# Patient Record
Sex: Male | Born: 2000 | Race: Black or African American | Hispanic: No | Marital: Single | Smoking: Never smoker
Health system: Southern US, Community
[De-identification: ages and names within clinical notes are randomized; demographics above are authoritative.]

## PROBLEM LIST (undated history)

## (undated) DIAGNOSIS — J45909 Unspecified asthma, uncomplicated: Secondary | ICD-10-CM

## (undated) HISTORY — DX: Unspecified asthma, uncomplicated: J45.909

## (undated) HISTORY — PX: NO PAST SURGERIES: SHX2092

---
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
## 2018-01-23 ENCOUNTER — Encounter (HOSPITAL_COMMUNITY): Payer: Self-pay | Admitting: Family Medicine

## 2018-01-23 ENCOUNTER — Ambulatory Visit (HOSPITAL_COMMUNITY)
Admission: EM | Admit: 2018-01-23 | Discharge: 2018-01-23 | Disposition: A | Discharge status: Home / Self Care | Payer: Medicaid Other | Attending: Family Medicine | Admitting: Family Medicine

## 2018-01-23 DIAGNOSIS — J069 Acute upper respiratory infection, unspecified: Secondary | ICD-10-CM

## 2018-01-23 NOTE — ED Provider Notes (Signed)
  [facility]   [id number] 01/23/18 Arrival Time: 1013  ASSESSMENT & PLAN:  1. Viral upper respiratory tract infection    Discussed typical duration of symptoms. OTC symptom care as needed; Sudafed. Work note given. Ensure adequate fluid intake and rest. May f/u with PCP or here as needed.  Reviewed expectations re: course of current medical issues. Questions answered. Outlined signs and symptoms indicating need for more acute intervention. Patient verbalized understanding. After Visit Summary given.   SUBJECTIVE: History from: patient.  Marc Duran is a 17 y.o. male who presents with complaint of nasal congestion, post-nasal drainage, and a persistent dry cough. Mild frontal HA; correlated with nasal congestion. Onset abrupt, approximately 4 days ago. Overall fatigued with body aches. SOB: none. Wheezing: none. Fever: no. Overall normal PO intake without n/v. Sick contacts: no. OTC treatment: Mucinex with mild help.   Social History   Tobacco Use  Smoking Status Never Smoker    ROS: As per HPI.   OBJECTIVE:  Vitals:   01/23/18 1109  BP: 131/71  Pulse: 76  Resp: 18  Temp: 98.8 F (37.1 C)  SpO2: 100%     General appearance: alert; appears fatigued HEENT: nasal congestion; clear runny nose; throat irritation secondary to post-nasal drainage Neck: supple without LAD Lungs: unlabored respirations, symmetrical air entry; cough: absent; no respiratory distress Skin: warm and dry Psychological: alert and cooperative; normal mood and affect   No Known Allergies  Past Medical History:  Diagnosis Date  . Asthma    History reviewed. No pertinent family history. Social History   Socioeconomic History  . Marital status: Single    Spouse name: Not on file  . Number of children: Not on file  . Years of education: Not on file  . Highest education level: Not on file  Social Needs  . Financial resource strain: Not on file  . Food insecurity -  worry: Not on file  . Food insecurity - inability: Not on file  . Transportation needs - medical: Not on file  . Transportation needs - non-medical: Not on file  Occupational History  . Not on file  Tobacco Use  . Smoking status: Never Smoker  Substance and Sexual Activity  . Alcohol use: Not on file  . Drug use: Not on file  . Sexual activity: Not on file  Other Topics Concern  . Not on file  Social History Narrative  . Not on file           Mardella LaymanHagler, Marc Fawaz, MD 01/23/18 1135

## 2018-01-23 NOTE — Discharge Instructions (Signed)
You may try over the counter Sudafed for a few days. This may help with your congestion.

## 2018-01-23 NOTE — ED Triage Notes (Signed)
Pt here for URI symptoms since Friday.  

## 2019-01-07 ENCOUNTER — Encounter: Payer: Self-pay | Admitting: Emergency Medicine

## 2019-01-07 ENCOUNTER — Ambulatory Visit
Admission: EM | Admit: 2019-01-07 | Discharge: 2019-01-07 | Disposition: A | Discharge status: Home / Self Care | Payer: Medicaid Other | Attending: Family Medicine | Admitting: Family Medicine

## 2019-01-07 DIAGNOSIS — R030 Elevated blood-pressure reading, without diagnosis of hypertension: Secondary | ICD-10-CM | POA: Insufficient documentation

## 2019-01-07 NOTE — ED Triage Notes (Addendum)
Pt states he was at the dentist today and his blood pressure was 150s/90s there. Denies history of high blood pressure. Pt does not have a PCP. Denies symptoms at this time, states he felt lightheaded once a few days ago.

## 2019-01-07 NOTE — ED Provider Notes (Signed)
[facility]    CSN: [id number] Arrival date & time: 01/07/19  1546     History   Chief Complaint Chief Complaint  Patient presents with  . Hypertension    HPI Marc Duran is a 18 y.o. male.   HPI  Patient is here for elevated blood pressure.  He went to the dentist today to have the tooth fixed.  His blood pressure was elevated and they refused to see him.  He took his blood pressure 3 times.  It stayed in the 150/90 range on 3 times.  He has never had high blood pressure before.  High blood pressure does run in his family.  I checked his chart and his blood pressures the last 2 visits is been normal.  He is not having any headaches dizziness visual symptoms nausea.  No chest pain shortness of breath. We discussed blood pressure today.  All the things that can cause him to have an elevated blood pressure.  He can be sleep deprivation, alcohol ingestion, stress, exercise, over-the-counter cold medicines, and so forth.  One elevated blood pressure does not mean he has the diagnosis of hypertension.  His blood pressure here is 150/82.  I explained to him that although this is elevated, it is not dangerous.  Recommend follow-up with the PCP.  PCP referral was given to patient   Past Medical History:  Diagnosis Date  . Asthma     There are no active problems to display for this patient.   History reviewed. No pertinent surgical history.     Home Medications    Prior to Admission medications   Not on File    Family History Family History  Problem Relation Age of Onset  . Healthy Mother   . Healthy Father   . Hypertension Maternal Grandmother     Social History Social History   Tobacco Use  . Smoking status: Never Smoker  . Smokeless tobacco: Current User  Substance Use Topics  . Alcohol use: Yes  . Drug use: Yes    Types: Marijuana     Allergies   Patient has no known allergies.   Review of Systems Review of Systems  Constitutional:  Negative for chills and fever.  HENT: Negative for ear pain and sore throat.   Eyes: Negative for pain and visual disturbance.  Respiratory: Negative for cough and shortness of breath.   Cardiovascular: Negative for chest pain and palpitations.  Gastrointestinal: Negative for abdominal pain and vomiting.  Genitourinary: Negative for dysuria and hematuria.  Musculoskeletal: Negative for arthralgias and back pain.  Skin: Negative for color change and rash.  Neurological: Negative for seizures and syncope.  All other systems reviewed and are negative.    Physical Exam Triage Vital Signs ED Triage Vitals  Enc Vitals Group     BP 01/07/19 1552 (!) 150/82     Pulse Rate 01/07/19 1552 (!) 55     Resp 01/07/19 1552 18     Temp 01/07/19 1552 97.7 F (36.5 C)     Temp src --      SpO2 01/07/19 1552 98 %     Weight --      Height --      Head Circumference --      Peak Flow --      Pain Score 01/07/19 1555 0     Pain Loc --      Pain Edu? --      Excl. in GC? --  No data found.  Updated Vital Signs BP (!) 150/82   Pulse (!) 55   Temp 97.7 F (36.5 C)   Resp 18   SpO2 98%   Visual Acuity Right Eye Distance:   Left Eye Distance:   Bilateral Distance:    Right Eye Near:   Left Eye Near:    Bilateral Near:     Physical Exam Constitutional:      General: He is not in acute distress.    Appearance: He is well-developed.  HENT:     Head: Normocephalic and atraumatic.  Eyes:     Conjunctiva/sclera: Conjunctivae normal.     Pupils: Pupils are equal, round, and reactive to light.  Neck:     Musculoskeletal: Normal range of motion.  Cardiovascular:     Rate and Rhythm: Regular rhythm. Bradycardia present.     Heart sounds: Normal heart sounds.  Pulmonary:     Effort: Pulmonary effort is normal. No respiratory distress.     Breath sounds: Normal breath sounds.  Abdominal:     General: There is no distension.     Palpations: Abdomen is soft.  Musculoskeletal: Normal  range of motion.  Skin:    General: Skin is warm and dry.  Neurological:     General: No focal deficit present.     Mental Status: He is alert. Mental status is at baseline.  Psychiatric:        Mood and Affect: Mood normal.        Thought Content: Thought content normal.      UC Treatments / Results  Labs (all labs ordered are listed, but only abnormal results are displayed) Labs Reviewed - No data to display  EKG None  Radiology No results found.  Procedures Procedures (including critical care time)  Medications Ordered in UC Medications - No data to display  Initial Impression / Assessment and Plan / UC Course  I have reviewed the triage vital signs and the nursing notes.  Pertinent labs & imaging results that were available during my care of the patient were reviewed by me and considered in my medical decision making (see chart for details).      Final Clinical Impressions(s) / UC Diagnoses   Final diagnoses:  Elevated blood-pressure reading without diagnosis of hypertension     Discharge Instructions     Blood pressure is elevated today A number of factors can cause your BP to go up We talked about diet, sleep, stress, alcohol and certain medicine You need to follow up with a PCP   ED Prescriptions    None     Controlled Substance Prescriptions [place] Controlled Substance Registry consulted? Not Applicable   Eustace Moore, MD 01/07/19 Windy Fast

## 2019-01-07 NOTE — Discharge Instructions (Signed)
Blood pressure is elevated today A number of factors can cause your BP to go up We talked about diet, sleep, stress, alcohol and certain medicine You need to follow up with a PCP

## 2019-01-24 ENCOUNTER — Ambulatory Visit (INDEPENDENT_AMBULATORY_CARE_PROVIDER_SITE_OTHER): Payer: Medicaid Other | Admitting: Family Medicine

## 2019-01-24 ENCOUNTER — Encounter: Payer: Self-pay | Admitting: Family Medicine

## 2019-01-24 VITALS — BP 155/98 | HR 65 | Resp 17 | Ht 67.0 in | Wt 154.0 lb

## 2019-01-24 DIAGNOSIS — Z7689 Persons encountering health services in other specified circumstances: Secondary | ICD-10-CM

## 2019-01-24 DIAGNOSIS — I10 Essential (primary) hypertension: Secondary | ICD-10-CM

## 2019-01-24 MED ORDER — HYDROCHLOROTHIAZIDE 25 MG PO TABS: 25.0000 mg | ORAL_TABLET | Freq: Every day | ORAL | 0 refills | Status: DC

## 2019-01-24 NOTE — Patient Instructions (Addendum)
[facility]!    Marc MannsKyshawn M Duran was seen by Joaquin CourtsKimberly Harris, FNP today.   [name] primary care provider is Bing NeighborsHarris, Kimberly S, FNP.   For the best care possible, you should try to see Joaquin CourtsKimberly Harris, FNP-C whenever you come to the clinic.   We look forward to seeing you again soon!  If you have any questions about your visit today, please call us at [phone] or feel free to reach your primary care provider via MyChart.       Hypertension Hypertension, commonly called high blood pressure, is when the force of blood pumping through the arteries is too strong. The arteries are the blood vessels that carry blood from the heart throughout the body. Hypertension forces the heart to work harder to pump blood and may cause arteries to become narrow or stiff. Having untreated or uncontrolled hypertension can cause heart attacks, strokes, kidney disease, and other problems. A blood pressure reading consists of a higher number over a lower number. Ideally, your blood pressure should be below 120/80. The first ("top") number is called the systolic pressure. It is a measure of the pressure in your arteries as your heart beats. The second ("bottom") number is called the diastolic pressure. It is a measure of the pressure in your arteries as the heart relaxes. What are the causes? The cause of this condition is not known. What increases the risk? Some risk factors for high blood pressure are under your control. Others are not. Factors you can change  Smoking.  Having type 2 diabetes mellitus, high cholesterol, or both.  Not getting enough exercise or physical activity.  Being overweight.  Having too much fat, sugar, calories, or salt (sodium) in your diet.  Drinking too much alcohol. Factors that are difficult or impossible to change  Having chronic kidney disease.  Having a family history of high blood  pressure.  Age. Risk increases with age.  Race. You may be at higher risk if you are African-American.  Gender. Men are at higher risk than women before age 20. After age 20, women are at higher risk than men.  Having obstructive sleep apnea.  Stress. What are the signs or symptoms? Extremely high blood pressure (hypertensive crisis) may cause:  Headache.  Anxiety.  Shortness of breath.  Nosebleed.  Nausea and vomiting.  Severe chest pain.  Jerky movements you cannot control (seizures). How is this diagnosed? This condition is diagnosed by measuring your blood pressure while you are seated, with your arm resting on a surface. The cuff of the blood pressure monitor will be placed directly against the skin of your upper arm at the level of your heart. It should be measured at least twice using the same arm. Certain conditions can cause a difference in blood pressure between your right and left arms. Certain factors can cause blood pressure readings to be lower or higher than normal (elevated) for a short period of time:  When your blood pressure is higher when you are in a health care provider's office than when you are at home, this is called white coat hypertension. Most people with this condition do not need medicines.  When your blood pressure is higher at home than when you are in a health care provider's office, this is called masked hypertension. Most people with this condition may need medicines to control blood pressure. If you have a high blood pressure reading during  one visit or you have normal blood pressure with other risk factors:  You may be asked to return on a different day to have your blood pressure checked again.  You may be asked to monitor your blood pressure at home for 1 week or longer. If you are diagnosed with hypertension, you may have other blood or imaging tests to help your health care provider understand your overall risk for other conditions. How  is this treated? This condition is treated by making healthy lifestyle changes, such as eating healthy foods, exercising more, and reducing your alcohol intake. Your health care provider may prescribe medicine if lifestyle changes are not enough to get your blood pressure under control, and if:  Your systolic blood pressure is above 130.  Your diastolic blood pressure is above 80. Your personal target blood pressure may vary depending on your medical conditions, your age, and other factors. Follow these instructions at home: Eating and drinking   Eat a diet that is high in fiber and potassium, and low in sodium, added sugar, and fat. An example eating plan is called the DASH (Dietary Approaches to Stop Hypertension) diet. To eat this way: ? Eat plenty of fresh fruits and vegetables. Try to fill half of your plate at each meal with fruits and vegetables. ? Eat whole grains, such as whole wheat pasta, brown rice, or whole grain bread. Fill about one quarter of your plate with whole grains. ? Eat or drink low-fat dairy products, such as skim milk or low-fat yogurt. ? Avoid fatty cuts of meat, processed or cured meats, and poultry with skin. Fill about one quarter of your plate with lean proteins, such as fish, chicken without skin, beans, eggs, and tofu. ? Avoid premade and processed foods. These tend to be higher in sodium, added sugar, and fat.  Reduce your daily sodium intake. Most people with hypertension should eat less than 1,500 mg of sodium a day.  Limit alcohol intake to no more than 1 drink a day for nonpregnant women and 2 drinks a day for men. One drink equals 12 oz of beer, 5 oz of wine, or 1 oz of hard liquor. Lifestyle   Work with your health care provider to maintain a healthy body weight or to lose weight. Ask what an ideal weight is for you.  Get at least 30 minutes of exercise that causes your heart to beat faster (aerobic exercise) most days of the week. Activities may  include walking, swimming, or biking.  Include exercise to strengthen your muscles (resistance exercise), such as pilates or lifting weights, as part of your weekly exercise routine. Try to do these types of exercises for 30 minutes at least 3 days a week.  Do not use any products that contain nicotine or tobacco, such as cigarettes and e-cigarettes. If you need help quitting, ask your health care provider.  Monitor your blood pressure at home as told by your health care provider.  Keep all follow-up visits as told by your health care provider. This is important. Medicines  Take over-the-counter and prescription medicines only as told by your health care provider. Follow directions carefully. Blood pressure medicines must be taken as prescribed.  Do not skip doses of blood pressure medicine. Doing this puts you at risk for problems and can make the medicine less effective.  Ask your health care provider about side effects or reactions to medicines that you should watch for. Contact a health care provider if:  You think you  are having a reaction to a medicine you are taking.  You have headaches that keep coming back (recurring).  You feel dizzy.  You have swelling in your ankles.  You have trouble with your vision. Get help right away if:  You develop a severe headache or confusion.  You have unusual weakness or numbness.  You feel faint.  You have severe pain in your chest or abdomen.  You vomit repeatedly.  You have trouble breathing. Summary  Hypertension is when the force of blood pumping through your arteries is too strong. If this condition is not controlled, it may put you at risk for serious complications.  Your personal target blood pressure may vary depending on your medical conditions, your age, and other factors. For most people, a normal blood pressure is less than 120/80.  Hypertension is treated with lifestyle changes, medicines, or a combination of both.  Lifestyle changes include weight loss, eating a healthy, low-sodium diet, exercising more, and limiting alcohol. This information is not intended to replace advice given to you by your health care provider. Make sure you discuss any questions you have with your health care provider. Document Released: 11/28/2005 Document Revised: 10/26/2016 Document Reviewed: 10/26/2016 Elsevier Interactive Patient Education  2019 [employer].

## 2019-01-24 NOTE — Progress Notes (Signed)
Marc Duran, is a 18 y.o. male  ZRA:[id number]  LKT:[id number]  DOB - 2001-10-19  CC:  Chief Complaint  Patient presents with  . Establish Care  . Hypertension       HPI: Marc Duran is a 18 y.o. male is here today to establish care evaluation of elevated blood pressure readings.  [facility] does not have a problem list on file.   Hypertension, new  Patient presents today for evaluation of multiple recent elevation in blood pressure readings. He has no extreme hypertension or renal disease.  He reports no history of an abnormal birth or congenital anomalies.  He was recently at the dentist to have some dental work performed and he brings with him his readings.  His blood pressure was elevated greater than 150/90 with multiple readings.  He also subsequently presented to urgent care patient on 01/07/2019 and was noted to be hypertensive during that visit as well with a blood pressure reading of 150/82.  He endorses heavy marijuana use on a consistent daily basis.  Denies any alcohol or other illicit drug use.  He has a family history significant for hypertension in both parents.  Denies new headaches, chest pain, visual changes, abdominal pain, nausea, new weakness , numbness or tingling, SOB, edema, or worrisome cough.   Current medications:No current outpatient medications on file.   Pertinent family medical history: family history includes Diabetes in his maternal grandmother; Healthy in his mother; Hypertension in his father, maternal grandfather, maternal grandmother, paternal grandfather, and paternal grandmother.   No Known Allergies  Social History   Socioeconomic History  . Marital status: Single    Spouse name: Not on file  . Number of children: Not on file  . Years of education: Not on file  . Highest education level: Not on file  Occupational History  . Not on file  Social Needs  . Financial resource strain: Not on file  . Food insecurity:    Worry: Not on file   Inability: Not on file  . Transportation needs:    Medical: Not on file    Non-medical: Not on file  Tobacco Use  . Smoking status: Never Smoker  . Smokeless tobacco: Never Used  Substance and Sexual Activity  . Alcohol use: Never    Frequency: Never  . Drug use: Yes    Types: Marijuana  . Sexual activity: Not on file  Lifestyle  . Physical activity:    Days per week: Not on file    Minutes per session: Not on file  . Stress: Not on file  Relationships  . Social connections:    Talks on phone: Not on file    Gets together: Not on file    Attends religious service: Not on file    Active member of club or organization: Not on file    Attends meetings of clubs or organizations: Not on file    Relationship status: Not on file  . Intimate partner violence:    Fear of current or ex partner: Not on file    Emotionally abused: Not on file    Physically abused: Not on file    Forced sexual activity: Not on file  Other Topics Concern  . Not on file  Social History Narrative  . Not on file    Review of Systems: Pertinent negatives listed in HPI Objective:   Vitals:   01/24/19 1512  BP: (!) 155/98  Pulse: 65  Resp: 17  SpO2: 98%  BP Readings from Last 3 Encounters:  01/24/19 (!) 155/98  01/07/19 (!) 150/82  01/23/18 131/71    Filed Weights   01/24/19 1512  Weight: 154 lb (69.9 kg)      Physical Exam: Constitutional: Patient appears well-developed and well-nourished. No distress. HENT: Normocephalic, atraumatic, External right and left ear normal.  Eyes: Conjunctivae and EOM are normal. PERRLA, no scleral icterus. Neck: Normal ROM. Neck supple. No JVD. No tracheal deviation. No thyromegaly. CVS: RRR, S1/S2 +, no murmurs, no gallops, no carotid bruit.  Pulmonary: Effort and breath sounds normal, no stridor, rhonchi, wheezes, rales.  Abdominal: Soft. BS +, no distension, tenderness, rebound or guarding.  Musculoskeletal: Normal range of motion. No edema and no  tenderness.  Neuro: Alert. Normal muscle tone coordination. Normal gait.  Skin: Skin is warm and dry. No rash noted. Not diaphoretic. No erythema. No pallor. Psychiatric: Normal mood and affect. Behavior, judgment, thought content normal.  Lab Results (prior encounters)       Assessment and plan:  1. Encounter to establish care  2. Essential hypertension, uncontrolled, new  -Start on HCTZ 25 mg once daily -Strongly encouraged discontinuation of marijuana smoking as this can adversely contribute to elevated BP readings.  Checking the following: - Comprehensive metabolic panel - CBC with Differential - TSH - Hemoglobin A1c  A total of 25 minutes spent, greater than 50 % of this time was spent counseling and coordination of care.   Return in about 6 weeks (around 03/07/2019).   The patient was given clear instructions to go to ER or return to medical center if symptoms don't improve, worsen or new problems develop. The patient verbalized understanding. The patient was advised  to call and obtain lab results if they haven't heard anything from out office within 7-10 business days.  Joaquin Courts, FNP Primary Care at [facility] [address], [place] [address] [phone]: [phone]    This note has been created with Dragon speech recognition software and Paediatric nurse. Any transcriptional errors are unintentional.

## 2019-01-25 LAB — CBC WITH DIFFERENTIAL/PLATELET
Basophils Absolute: 0 10*3/uL (ref 0.0–0.2)
Basos: 0 %
EOS (ABSOLUTE): 0.1 10*3/uL (ref 0.0–0.4)
EOS: 1 %
HEMATOCRIT: 50.1 % (ref 37.5–51.0)
HEMOGLOBIN: 17.8 g/dL — AB (ref 13.0–17.7)
IMMATURE GRANS (ABS): 0 10*3/uL (ref 0.0–0.1)
IMMATURE GRANULOCYTES: 0 %
LYMPHS: 42 %
Lymphocytes Absolute: 3.2 10*3/uL — ABNORMAL HIGH (ref 0.7–3.1)
MCH: 29.2 pg (ref 26.6–33.0)
MCHC: 35.5 g/dL (ref 31.5–35.7)
MCV: 82 fL (ref 79–97)
Monocytes Absolute: 0.8 10*3/uL (ref 0.1–0.9)
Monocytes: 10 %
NEUTROS PCT: 47 %
Neutrophils Absolute: 3.5 10*3/uL (ref 1.4–7.0)
PLATELETS: 323 10*3/uL (ref 150–450)
RBC: 6.09 x10E6/uL — ABNORMAL HIGH (ref 4.14–5.80)
RDW: 13 % (ref 11.6–15.4)
WBC: 7.6 10*3/uL (ref 3.4–10.8)

## 2019-01-25 LAB — COMPREHENSIVE METABOLIC PANEL
ALT: 14 IU/L (ref 0–44)
AST: 14 IU/L (ref 0–40)
Albumin/Globulin Ratio: 1.8 (ref 1.2–2.2)
Albumin: 5.3 g/dL — ABNORMAL HIGH (ref 4.1–5.2)
Alkaline Phosphatase: 94 IU/L (ref 39–117)
BUN/Creatinine Ratio: 10 (ref 9–20)
BUN: 9 mg/dL (ref 6–20)
Bilirubin Total: 0.7 mg/dL (ref 0.0–1.2)
CALCIUM: 10.7 mg/dL — AB (ref 8.7–10.2)
CO2: 25 mmol/L (ref 20–29)
CREATININE: 0.91 mg/dL (ref 0.76–1.27)
Chloride: 100 mmol/L (ref 96–106)
GFR, EST AFRICAN AMERICAN: 141 mL/min/{1.73_m2} (ref >59)
GFR, EST NON AFRICAN AMERICAN: 122 mL/min/{1.73_m2} (ref >59)
GLOBULIN, TOTAL: 3 g/dL (ref 1.5–4.5)
Glucose: 93 mg/dL (ref 65–99)
Potassium: 4.3 mmol/L (ref 3.5–5.2)
Sodium: 139 mmol/L (ref 134–144)
TOTAL PROTEIN: 8.3 g/dL (ref 6.0–8.5)

## 2019-01-25 LAB — HEMOGLOBIN A1C
Est. average glucose Bld gHb Est-mCnc: 103 mg/dL
Hgb A1c MFr Bld: 5.2 % (ref 4.8–5.6)

## 2019-01-25 LAB — TSH: TSH: 1.87 u[IU]/mL (ref 0.450–4.500)

## 2019-01-28 NOTE — Progress Notes (Signed)
Patient notified of results & recommendations. Expressed understanding.

## 2019-03-07 ENCOUNTER — Other Ambulatory Visit: Payer: Self-pay

## 2019-03-07 ENCOUNTER — Encounter: Payer: Self-pay | Admitting: Family Medicine

## 2019-03-07 ENCOUNTER — Ambulatory Visit (INDEPENDENT_AMBULATORY_CARE_PROVIDER_SITE_OTHER): Payer: Medicaid Other | Admitting: Family Medicine

## 2019-03-07 VITALS — BP 135/81 | HR 69 | Temp 98.6°F | Resp 16 | Ht 67.0 in | Wt 152.6 lb

## 2019-03-07 DIAGNOSIS — I10 Essential (primary) hypertension: Secondary | ICD-10-CM | POA: Diagnosis not present

## 2019-03-07 DIAGNOSIS — Z1389 Encounter for screening for other disorder: Secondary | ICD-10-CM

## 2019-03-07 LAB — POCT URINALYSIS DIP (CLINITEK)
Bilirubin, UA: NEGATIVE
Glucose, UA: NEGATIVE mg/dL
Ketones, POC UA: NEGATIVE mg/dL
Leukocytes, UA: NEGATIVE
Nitrite, UA: NEGATIVE
PH UA: 7 (ref 5.0–8.0)
POC PROTEIN,UA: NEGATIVE
RBC UA: NEGATIVE
Spec Grav, UA: 1.025 (ref 1.010–1.025)
UROBILINOGEN UA: 1 U/dL

## 2019-03-07 MED ORDER — HYDROCHLOROTHIAZIDE 25 MG PO TABS: 25.0000 mg | ORAL_TABLET | Freq: Every day | ORAL | 2 refills | Status: DC

## 2019-03-07 NOTE — Patient Instructions (Signed)
Managing Your Hypertension  Hypertension is commonly called high blood pressure. This is when the force of your blood pressing against the walls of your arteries is too strong. Arteries are blood vessels that carry blood from your heart throughout your body. Hypertension forces the heart to work harder to pump blood, and may cause the arteries to become narrow or stiff. Having untreated or uncontrolled hypertension can cause heart attack, stroke, kidney disease, and other problems.  What are blood pressure readings?  A blood pressure reading consists of a higher number over a lower number. Ideally, your blood pressure should be below 120/80. The first ("top") number is called the systolic pressure. It is a measure of the pressure in your arteries as your heart beats. The second ("bottom") number is called the diastolic pressure. It is a measure of the pressure in your arteries as the heart relaxes.  What does my blood pressure reading mean?  Blood pressure is classified into four stages. Based on your blood pressure reading, your health care provider may use the following stages to determine what type of treatment you need, if any. Systolic pressure and diastolic pressure are measured in a unit called mm Hg.  Normal   Systolic pressure: below 120.   Diastolic pressure: below 80.  Elevated   Systolic pressure: 120-129.   Diastolic pressure: below 80.  Hypertension stage 1   Systolic pressure: 130-139.   Diastolic pressure: 80-89.  Hypertension stage 2   Systolic pressure: 140 or above.   Diastolic pressure: 90 or above.  What health risks are associated with hypertension?  Managing your hypertension is an important responsibility. Uncontrolled hypertension can lead to:   A heart attack.   A stroke.   A weakened blood vessel (aneurysm).   Heart failure.   Kidney damage.   Eye damage.   Metabolic syndrome.   Memory and concentration problems.  What changes can I make to manage my  hypertension?  Hypertension can be managed by making lifestyle changes and possibly by taking medicines. Your health care provider will help you make a plan to bring your blood pressure within a normal range.  Eating and drinking     Eat a diet that is high in fiber and potassium, and low in salt (sodium), added sugar, and fat. An example eating plan is called the DASH (Dietary Approaches to Stop Hypertension) diet. To eat this way:  ? Eat plenty of fresh fruits and vegetables. Try to fill half of your plate at each meal with fruits and vegetables.  ? Eat whole grains, such as whole wheat pasta, brown rice, or whole grain bread. Fill about one quarter of your plate with whole grains.  ? Eat low-fat diary products.  ? Avoid fatty cuts of meat, processed or cured meats, and poultry with skin. Fill about one quarter of your plate with lean proteins such as fish, chicken without skin, beans, eggs, and tofu.  ? Avoid premade and processed foods. These tend to be higher in sodium, added sugar, and fat.   Reduce your daily sodium intake. Most people with hypertension should eat less than 1,500 mg of sodium a day.   Limit alcohol intake to no more than 1 drink a day for nonpregnant women and 2 drinks a day for men. One drink equals 12 oz of beer, 5 oz of wine, or 1 oz of hard liquor.  Lifestyle   Work with your health care provider to maintain a healthy body weight, or to lose   weight. Ask what an ideal weight is for you.   Get at least 30 minutes of exercise that causes your heart to beat faster (aerobic exercise) most days of the week. Activities may include walking, swimming, or biking.   Include exercise to strengthen your muscles (resistance exercise), such as weight lifting, as part of your weekly exercise routine. Try to do these types of exercises for 30 minutes at least 3 days a week.   Do not use any products that contain nicotine or tobacco, such as cigarettes and e-cigarettes. If you need help quitting,  ask your health care provider.   Control any long-term (chronic) conditions you have, such as high cholesterol or diabetes.  Monitoring   Monitor your blood pressure at home as told by your health care provider. Your personal target blood pressure may vary depending on your medical conditions, your age, and other factors.   Have your blood pressure checked regularly, as often as told by your health care provider.  Working with your health care provider   Review all the medicines you take with your health care provider because there may be side effects or interactions.   Talk with your health care provider about your diet, exercise habits, and other lifestyle factors that may be contributing to hypertension.   Visit your health care provider regularly. Your health care provider can help you create and adjust your plan for managing hypertension.  Will I need medicine to control my blood pressure?  Your health care provider may prescribe medicine if lifestyle changes are not enough to get your blood pressure under control, and if:   Your systolic blood pressure is 130 or higher.   Your diastolic blood pressure is 80 or higher.  Take medicines only as told by your health care provider. Follow the directions carefully. Blood pressure medicines must be taken as prescribed. The medicine does not work as well when you skip doses. Skipping doses also puts you at risk for problems.  Contact a health care provider if:   You think you are having a reaction to medicines you have taken.   You have repeated (recurrent) headaches.   You feel dizzy.   You have swelling in your ankles.   You have trouble with your vision.  Get help right away if:   You develop a severe headache or confusion.   You have unusual weakness or numbness, or you feel faint.   You have severe pain in your chest or abdomen.   You vomit repeatedly.   You have trouble breathing.  Summary   Hypertension is when the force of blood pumping  through your arteries is too strong. If this condition is not controlled, it may put you at risk for serious complications.   Your personal target blood pressure may vary depending on your medical conditions, your age, and other factors. For most people, a normal blood pressure is less than 120/80.   Hypertension is managed by lifestyle changes, medicines, or both. Lifestyle changes include weight loss, eating a healthy, low-sodium diet, exercising more, and limiting alcohol.  This information is not intended to replace advice given to you by your health care provider. Make sure you discuss any questions you have with your health care provider.  Document Released: 08/22/2012 Document Revised: 10/26/2016 Document Reviewed: 10/26/2016  Elsevier Interactive Patient Education  2019 [employer]

## 2019-03-07 NOTE — Progress Notes (Signed)
Patient ID: Marc Duran, male    DOB: 02/14/2001, 18 y.o.   MRN: [id number]  PCP: Bing Neighbors, FNP  Chief Complaint  Patient presents with  . Hypertension    Subjective:  HPI Marc Duran is a 18 y.o. male presents for evaluation pretension follow-up. Patient was newly diagnosed with hypertension on 01/24/2019. Patient had previously presented to have dental work performed and he was excessively hypertensive and was directed to urgent care.  Upon arrival urgent care blood pressure remained elevated. Patient was seen in office on 01/24/2019 and started on hydrochlorothiazide 25 mg.  He reports he has been monitoring blood pressure intermittently at home and readings have been less than 140/80. Patient reports he has cut back on smoking marijuana although has not completely eliminated this behavior.  He continues to deny any chest pain, shortness of breath, swelling, dizziness or visual changes. Social History   Socioeconomic History  . Marital status: Single    Spouse name: Not on file  . Number of children: Not on file  . Years of education: Not on file  . Highest education level: Not on file  Occupational History  . Not on file  Social Needs  . Financial resource strain: Not on file  . Food insecurity:    Worry: Not on file    Inability: Not on file  . Transportation needs:    Medical: Not on file    Non-medical: Not on file  Tobacco Use  . Smoking status: Never Smoker  . Smokeless tobacco: Never Used  Substance and Sexual Activity  . Alcohol use: Never    Frequency: Never  . Drug use: Yes    Types: Marijuana  . Sexual activity: Not on file  Lifestyle  . Physical activity:    Days per week: Not on file    Minutes per session: Not on file  . Stress: Not on file  Relationships  . Social connections:    Talks on phone: Not on file    Gets together: Not on file    Attends religious service: Not on file    Active member of club or organization: Not on file   Attends meetings of clubs or organizations: Not on file    Relationship status: Not on file  . Intimate partner violence:    Fear of current or ex partner: Not on file    Emotionally abused: Not on file    Physically abused: Not on file    Forced sexual activity: Not on file  Other Topics Concern  . Not on file  Social History Narrative  . Not on file    Family History  Problem Relation Age of Onset  . Healthy Mother   . Hypertension Father   . Hypertension Maternal Grandmother   . Diabetes Maternal Grandmother   . Hypertension Maternal Grandfather   . Hypertension Paternal Grandmother   . Hypertension Paternal Grandfather     Review of Systems Pertinent negatives listed in HPI  Prior to Admission medications   Medication Sig Start Date End Date Taking? Authorizing Provider  hydrochlorothiazide (HYDRODIURIL) 25 MG tablet Take 1 tablet (25 mg total) by mouth daily. 03/07/19  Yes Bing Neighbors, FNP    Past Medical, Surgical Family and Social History reviewed and updated.    Objective:   Today's Vitals   03/07/19 1456  BP: 135/81  Pulse: 69  Resp: 16  Temp: 98.6 F (37 C)  TempSrc: Oral  SpO2: 98%  Weight: 152  lb 9.6 oz (69.2 kg)  Height: 5\' 7"  (1.702 m)    Wt Readings from Last 3 Encounters:  03/07/19 152 lb 9.6 oz (69.2 kg) (47 %, Z= -0.07)*  01/24/19 154 lb (69.9 kg) (50 %, Z= 0.00)*  09/24/16 167 lb 12.8 oz (76.1 kg) (81 %, Z= 0.89)*   * Growth percentiles are based on CDC (Boys, 2-20 Years) data.    Physical Exam General appearance: alert, well developed, well nourished, cooperative and in no distress Head: Normocephalic, without obvious abnormality, atraumatic Respiratory: Respirations even and unlabored, normal respiratory rate Heart: rate and rhythm normal. No gallop or murmurs noted on exam  Abdomen: BS +, no distention, no rebound tenderness, or no mass Extremities: No gross deformities Skin: Skin color, texture, turgor normal. No rashes  seen  Psych: Appropriate mood and affect. Neurologic: Mental status: Alert, oriented to person, place, and time, thought content appropriate.  No results found for: POCGLU  Lab Results  Component Value Date   HGBA1C 5.2 01/24/2019    Assessment & Plan:  1. Essential hypertension, controlled today. Encouraged smoking cessation. - POCT URINALYSIS DIP (CLINITEK), negative for protein or blood.  -The patient was given clear instructions to go to ER or return to medical center if symptoms do not improve, worsen or new problems develop. The patient verbalized understanding.    Joaquin Courts, FNP Primary Care at [facility] [address], [place] [address] [phone]: [phone]

## 2019-09-06 ENCOUNTER — Telehealth: Payer: Self-pay

## 2019-09-06 NOTE — Telephone Encounter (Signed)
Called patient to do their pre-visit COVID screening.  Call went to voicemail. Unable to do prescreening.  

## 2019-09-09 ENCOUNTER — Ambulatory Visit (INDEPENDENT_AMBULATORY_CARE_PROVIDER_SITE_OTHER): Payer: Medicaid Other | Admitting: Family Medicine

## 2019-09-09 ENCOUNTER — Other Ambulatory Visit: Payer: Self-pay

## 2019-09-09 VITALS — BP 125/66 | HR 65 | Temp 97.3°F | Resp 17 | Ht 67.0 in | Wt 158.2 lb

## 2019-09-09 DIAGNOSIS — I10 Essential (primary) hypertension: Secondary | ICD-10-CM | POA: Diagnosis not present

## 2019-09-09 DIAGNOSIS — Z8679 Personal history of other diseases of the circulatory system: Secondary | ICD-10-CM | POA: Insufficient documentation

## 2019-09-09 NOTE — Progress Notes (Signed)
Established Patient Office Visit  Subjective:  Patient ID: Marc Duran, male    DOB: 04/25/2001  Age: 18 y.o. MRN: [id number]  CC:  Chief Complaint  Patient presents with  . Hypertension    HPI Marc MannsKyshawn M Rosiak presents for follow-up of hypertension but patient reports that he has not taken the hydrochlorothiazide prescribed for hypertension in more than a month.  He believes that his blood pressure was previously elevated because he was stressed out about not having a job but patient is now employed.  He has checked his blood pressures at home and they stay around 130 or less for the top number and 70-80 for the bottom number.  He denies any headaches or dizziness.  He does have family history significant for hypertension.  Past Medical History:  Diagnosis Date  . Childhood asthma     Past Surgical History:  Procedure Laterality Date  . NO PAST SURGERIES      Family History  Problem Relation Age of Onset  . Healthy Mother   . Hypertension Father   . Hypertension Maternal Grandmother   . Diabetes Maternal Grandmother   . Hypertension Maternal Grandfather   . Hypertension Paternal Grandmother   . Hypertension Paternal Grandfather     Social History   Socioeconomic History  . Marital status: Single    Spouse name: Not on file  . Number of children: Not on file  . Years of education: Not on file  . Highest education level: Not on file  Occupational History  . Not on file  Social Needs  . Financial resource strain: Not on file  . Food insecurity    Worry: Not on file    Inability: Not on file  . Transportation needs    Medical: Not on file    Non-medical: Not on file  Tobacco Use  . Smoking status: Never Smoker  . Smokeless tobacco: Never Used  Substance and Sexual Activity  . Alcohol use: Never    Frequency: Never  . Drug use: Yes    Types: Marijuana  . Sexual activity: Not on file  Lifestyle  . Physical activity    Days per week: Not on file     Minutes per session: Not on file  . Stress: Not on file  Relationships  . Social Musicianconnections    Talks on phone: Not on file    Gets together: Not on file    Attends religious service: Not on file    Active member of club or organization: Not on file    Attends meetings of clubs or organizations: Not on file    Relationship status: Not on file  . Intimate partner violence    Fear of current or ex partner: Not on file    Emotionally abused: Not on file    Physically abused: Not on file    Forced sexual activity: Not on file  Other Topics Concern  . Not on file  Social History Narrative  . Not on file    Outpatient Medications Prior to Visit  Medication Sig Dispense Refill  . hydrochlorothiazide (HYDRODIURIL) 25 MG tablet Take 1 tablet (25 mg total) by mouth daily. 90 tablet 2   No facility-administered medications prior to visit.     No Known Allergies  ROS Review of Systems  Constitutional: Negative for chills, fatigue and fever.  Eyes: Negative for photophobia and visual disturbance.  Respiratory: Negative for cough and shortness of breath.   Cardiovascular: Negative for chest  pain, palpitations and leg swelling.  Gastrointestinal: Negative for abdominal pain, constipation, diarrhea and nausea.  Endocrine: Negative for cold intolerance, heat intolerance, polydipsia, polyphagia and polyuria.  Genitourinary: Negative for dysuria and frequency.  Musculoskeletal: Negative for arthralgias and back pain.  Neurological: Negative for dizziness and headaches.      Objective:    Physical Exam  Constitutional: He is oriented to person, place, and time. He appears well-developed and well-nourished.  WNWD young adult male in NAD  Neck: Normal range of motion. Neck supple. No JVD present. No thyromegaly present.  Cardiovascular: Normal rate and regular rhythm.  Possible very faint murmur on exam and noise in the left carotid  Pulmonary/Chest: Effort normal and breath sounds  normal.  Abdominal: Soft. There is no abdominal tenderness. There is no rebound.  Musculoskeletal:        General: No tenderness or edema.     Comments: No CVA tenderness  Lymphadenopathy:    He has no cervical adenopathy.  Neurological: He is alert and oriented to person, place, and time.  Skin: Skin is warm and dry.  Psychiatric: He has a normal mood and affect. His behavior is normal.  Nursing note and vitals reviewed.   BP 125/66   Pulse 65   Temp (!) 97.3 F (36.3 C) (Temporal)   Resp 17   Ht 5\' 7"  (1.702 m)   Wt 158 lb 3.2 oz (71.8 kg)   SpO2 98%   BMI 24.78 kg/m  Wt Readings from Last 3 Encounters:  09/09/19 158 lb 3.2 oz (71.8 kg)  03/07/19 152 lb 9.6 oz (69.2 kg) (47 %, Z= -0.07)*  01/24/19 154 lb (69.9 kg) (50 %, Z= 0.00)*   * Growth percentiles are based on CDC (Boys, 2-20 Years) data.     There are no preventive care reminders to display for this patient. Declines testing here but states that he had a negative test earlier this year  Lab Results  Component Value Date   TSH 1.870 01/24/2019   Lab Results  Component Value Date   WBC 7.6 01/24/2019   HGB 17.8 (H) 01/24/2019   HCT 50.1 01/24/2019   MCV 82 01/24/2019   PLT 323 01/24/2019   Lab Results  Component Value Date   NA 139 01/24/2019   K 4.3 01/24/2019   CO2 25 01/24/2019   GLUCOSE 93 01/24/2019   BUN 9 01/24/2019   CREATININE 0.91 01/24/2019   BILITOT 0.7 01/24/2019   ALKPHOS 94 01/24/2019   AST 14 01/24/2019   ALT 14 01/24/2019   PROT 8.3 01/24/2019   ALBUMIN 5.3 (H) 01/24/2019   CALCIUM 10.7 (H) 01/24/2019   No results found for: CHOL No results found for: HDL No results found for: LDLCALC No results found for: TRIG No results found for: [facility] Lab Results  Component Value Date   HGBA1C 5.2 01/24/2019      Assessment & Plan:  1. History of hypertension Patient's blood pressure is normal at today's visit and he reports that blood pressures are normal by home monitoring and  he is no longer taking the hydrochlorothiazide that he was given by his prior provider.  He is encouraged to continue to periodically monitor his blood pressure and follow a low-sodium/DASH diet.  He was provided with educational information on DASH diet and hypertension as part of his after visit summary.  Discussed with patient that he is likely at increased risk of future development of hypertension due to his family history and having elevated  blood pressure/hypertension earlier this year.  On examination, patient seems to have a soft murmur with possible radiation to the left carotid versus murmur along with increased noise in the left carotid which could be due to tortuosity of the vessel or fibromuscular dysplasia as I doubt that he has any actual stenosis at his age.  He is being referred to cardiology to see if they also here these findings and if he needs further evaluation such as echo. - Ambulatory referral to Cardiology  An After Visit Summary was printed and given to the patient.  Follow-up: .Return for HTN- monitor home blood pressure and return as needed.   Cain Saupe, MD

## 2019-09-09 NOTE — Patient Instructions (Signed)
Preventing Hypertension Hypertension, commonly called high blood pressure, is when the force of blood pumping through the arteries is too strong. Arteries are blood vessels that carry blood from the heart throughout the body. Over time, hypertension can damage the arteries and decrease blood flow to important parts of the body, including the brain, heart, and kidneys. Often, hypertension does not cause symptoms until blood pressure is very high. For this reason, it is important to have your blood pressure checked on a regular basis. Hypertension can often be prevented with diet and lifestyle changes. If you already have hypertension, you can control it with diet and lifestyle changes, as well as medicine. What nutrition changes can be made? Maintain a healthy diet. This includes:  Eating less salt (sodium). Ask your health care provider how much sodium is safe for you to have. The general recommendation is to consume less than 1 tsp (2,300 mg) of sodium a day. ? Do not add salt to your food. ? Choose low-sodium options when grocery shopping and eating out.  Limiting fats in your diet. You can do this by eating low-fat or fat-free dairy products and by eating less red meat.  Eating more fruits, vegetables, and whole grains. Make a goal to eat: ? 1-2 cups of fresh fruits and vegetables each day. ? 3-4 servings of whole grains each day.  Avoiding foods and beverages that have added sugars.  Eating fish that contain healthy fats (omega-3 fatty acids), such as mackerel or salmon. If you need help putting together a healthy eating plan, try the DASH diet. This diet is high in fruits, vegetables, and whole grains. It is low in sodium, red meat, and added sugars. DASH stands for Dietary Approaches to Stop Hypertension. What lifestyle changes can be made?   Lose weight if you are overweight. Losing just 3?5% of your body weight can help prevent or control hypertension. ? For example, if your present  weight is 200 lb (91 kg), a loss of 3-5% of your weight means losing 6-10 lb (2.7-4.5 kg). ? Ask your health care provider to help you with a diet and exercise plan to safely lose weight.  Get enough exercise. Do at least 150 minutes of moderate-intensity exercise each week. ? You could do this in short exercise sessions several times a day, or you could do longer exercise sessions a few times a week. For example, you could take a brisk 10-minute walk or bike ride, 3 times a day, for 5 days a week.  Find ways to reduce stress, such as exercising, meditating, listening to music, or taking a yoga class. If you need help reducing stress, ask your health care provider.  Do not smoke. This includes e-cigarettes. Chemicals in tobacco and nicotine products raise your blood pressure each time you smoke. If you need help quitting, ask your health care provider.  Avoid alcohol. If you drink alcohol, limit alcohol intake to no more than 1 drink a day for nonpregnant women and 2 drinks a day for men. One drink equals 12 oz of beer, 5 oz of wine, or 1 oz of hard liquor. Why are these changes important? Diet and lifestyle changes can help you prevent hypertension, and they may make you feel better overall and improve your quality of life. If you have hypertension, making these changes will help you control it and help prevent major complications, such as:  Hardening and narrowing of arteries that supply blood to: ? Your heart. This can cause a heart  attack. ? Your brain. This can cause a stroke. ? Your kidneys. This can cause kidney failure.  Stress on your heart muscle, which can cause heart failure. What can I do to lower my risk?  Work with your health care provider to make a hypertension prevention plan that works for you. Follow your plan and keep all follow-up visits as told by your health care provider.  Learn how to check your blood pressure at home. Make sure that you know your personal target  blood pressure, as told by your health care provider. How is this treated? In addition to diet and lifestyle changes, your health care provider may recommend medicines to help lower your blood pressure. You may need to try a few different medicines to find what works best for you. You also may need to take more than one medicine. Take over-the-counter and prescription medicines only as told by your health care provider. Where to find support Your health care provider can help you prevent hypertension and help you keep your blood pressure at a healthy level. Your local hospital or your community may also provide support services and prevention programs. The American Heart Association offers an online support network at: https://www.lee.net/ Where to find more information Learn more about hypertension from:  National Heart, Lung, and Blood Institute: https://www.peterson.org/  Centers for Disease Control and Prevention: AboutHD.co.nz  American Academy of Family Physicians: http://familydoctor.org/familydoctor/en/diseases-conditions/high-blood-pressure.printerview.all.html Learn more about the DASH diet from:  National Heart, Lung, and Blood Institute: WedMap.it Contact a health care provider if:  You think you are having a reaction to medicines you have taken.  You have recurrent headaches or feel dizzy.  You have swelling in your ankles.  You have trouble with your vision. Summary  Hypertension often does not cause any symptoms until blood pressure is very high. It is important to get your blood pressure checked regularly.  Diet and lifestyle changes are the most important steps in preventing hypertension.  By keeping your blood pressure in a healthy range, you can prevent complications like heart attack, heart failure, stroke, and kidney failure.  Work with your health care  provider to make a hypertension prevention plan that works for you. This information is not intended to replace advice given to you by your health care provider. Make sure you discuss any questions you have with your health care provider. Document Released: 12/13/2015 Document Revised: 03/22/2019 Document Reviewed: 08/08/2016 Elsevier Patient Education  2020 Elsevier Inc.  DASH Eating Plan DASH stands for "Dietary Approaches to Stop Hypertension." The DASH eating plan is a healthy eating plan that has been shown to reduce high blood pressure (hypertension). It may also reduce your risk for type 2 diabetes, heart disease, and stroke. The DASH eating plan may also help with weight loss. What are tips for following this plan?  General guidelines  Avoid eating more than 2,300 mg (milligrams) of salt (sodium) a day. If you have hypertension, you may need to reduce your sodium intake to 1,500 mg a day.  Limit alcohol intake to no more than 1 drink a day for nonpregnant women and 2 drinks a day for men. One drink equals 12 oz of beer, 5 oz of wine, or 1 oz of hard liquor.  Work with your health care provider to maintain a healthy body weight or to lose weight. Ask what an ideal weight is for you.  Get at least 30 minutes of exercise that causes your heart to beat faster (aerobic exercise) most days of  the week. Activities may include walking, swimming, or biking.  Work with your health care provider or diet and nutrition specialist (dietitian) to adjust your eating plan to your individual calorie needs. Reading food labels   Check food labels for the amount of sodium per serving. Choose foods with less than 5 percent of the Daily Value of sodium. Generally, foods with less than 300 mg of sodium per serving fit into this eating plan.  To find whole grains, look for the word "whole" as the first word in the ingredient list. Shopping  Buy products labeled as "low-sodium" or "no salt added."   Buy fresh foods. Avoid canned foods and premade or frozen meals. Cooking  Avoid adding salt when cooking. Use salt-free seasonings or herbs instead of table salt or sea salt. Check with your health care provider or pharmacist before using salt substitutes.  Do not fry foods. Cook foods using healthy methods such as baking, boiling, grilling, and broiling instead.  Cook with heart-healthy oils, such as olive, canola, soybean, or sunflower oil. Meal planning  Eat a balanced diet that includes: ? 5 or more servings of fruits and vegetables each day. At each meal, try to fill half of your plate with fruits and vegetables. ? Up to 6-8 servings of whole grains each day. ? Less than 6 oz of lean meat, poultry, or fish each day. A 3-oz serving of meat is about the same size as a deck of cards. One egg equals 1 oz. ? 2 servings of low-fat dairy each day. ? A serving of nuts, seeds, or beans 5 times each week. ? Heart-healthy fats. Healthy fats called Omega-3 fatty acids are found in foods such as flaxseeds and coldwater fish, like sardines, salmon, and mackerel.  Limit how much you eat of the following: ? Canned or prepackaged foods. ? Food that is high in trans fat, such as fried foods. ? Food that is high in saturated fat, such as fatty meat. ? Sweets, desserts, sugary drinks, and other foods with added sugar. ? Full-fat dairy products.  Do not salt foods before eating.  Try to eat at least 2 vegetarian meals each week.  Eat more home-cooked food and less restaurant, buffet, and fast food.  When eating at a restaurant, ask that your food be prepared with less salt or no salt, if possible. What foods are recommended? The items listed may not be a complete list. Talk with your dietitian about what dietary choices are best for you. Grains Whole-grain or whole-wheat bread. Whole-grain or whole-wheat pasta. Brown rice. [place]ena Morrow. Bulgur. Whole-grain and low-sodium cereals. Pita bread.  Low-fat, low-sodium crackers. Whole-wheat flour tortillas. Vegetables Fresh or frozen vegetables (raw, steamed, roasted, or grilled). Low-sodium or reduced-sodium tomato and vegetable juice. Low-sodium or reduced-sodium tomato sauce and tomato paste. Low-sodium or reduced-sodium canned vegetables. Fruits All fresh, dried, or frozen fruit. Canned fruit in natural juice (without added sugar). Meat and other protein foods Skinless chicken or [place]. Ground chicken or [place]. Pork with fat trimmed off. Fish and seafood. Egg whites. Dried beans, peas, or lentils. Unsalted nuts, nut butters, and seeds. Unsalted canned beans. Lean cuts of beef with fat trimmed off. Low-sodium, lean deli meat. Dairy Low-fat (1%) or fat-free (skim) milk. Fat-free, low-fat, or reduced-fat cheeses. Nonfat, low-sodium ricotta or cottage cheese. Low-fat or nonfat yogurt. Low-fat, low-sodium cheese. Fats and oils Soft margarine without trans fats. Vegetable oil. Low-fat, reduced-fat, or light mayonnaise and salad dressings (reduced-sodium). Canola, safflower, olive, soybean, and sunflower  oils. Avocado. Seasoning and other foods Herbs. Spices. Seasoning mixes without salt. Unsalted popcorn and pretzels. Fat-free sweets. What foods are not recommended? The items listed may not be a complete list. Talk with your dietitian about what dietary choices are best for you. Grains Baked goods made with fat, such as croissants, muffins, or some breads. Dry pasta or rice meal packs. Vegetables Creamed or fried vegetables. Vegetables in a cheese sauce. Regular canned vegetables (not low-sodium or reduced-sodium). Regular canned tomato sauce and paste (not low-sodium or reduced-sodium). Regular tomato and vegetable juice (not low-sodium or reduced-sodium). Rosita FirePickles. Olives. Fruits Canned fruit in a light or heavy syrup. Fried fruit. Fruit in cream or butter sauce. Meat and other protein foods Fatty cuts of meat. Ribs. Fried meat. Tomasa BlaseBacon.  Sausage. Bologna and other processed lunch meats. Salami. Fatback. Hotdogs. Bratwurst. Salted nuts and seeds. Canned beans with added salt. Canned or smoked fish. Whole eggs or egg yolks. Chicken or [place] with skin. Dairy Whole or 2% milk, cream, and half-and-half. Whole or full-fat cream cheese. Whole-fat or sweetened yogurt. Full-fat cheese. Nondairy creamers. Whipped toppings. Processed cheese and cheese spreads. Fats and oils Butter. Stick margarine. Lard. Shortening. Ghee. Bacon fat. Tropical oils, such as coconut, palm kernel, or palm oil. Seasoning and other foods Salted popcorn and pretzels. Onion salt, garlic salt, seasoned salt, table salt, and sea salt. Worcestershire sauce. Tartar sauce. Barbecue sauce. Teriyaki sauce. Soy sauce, including reduced-sodium. Steak sauce. Canned and packaged gravies. Fish sauce. Oyster sauce. Cocktail sauce. Horseradish that you find on the shelf. Ketchup. Mustard. Meat flavorings and tenderizers. Bouillon cubes. Hot sauce and Tabasco sauce. Premade or packaged marinades. Premade or packaged taco seasonings. Relishes. Regular salad dressings. Where to find more information:  National Heart, Lung, and Blood Institute: PopSteam.iswww.nhlbi.nih.gov  American Heart Association: www.heart.org Summary  The DASH eating plan is a healthy eating plan that has been shown to reduce high blood pressure (hypertension). It may also reduce your risk for type 2 diabetes, heart disease, and stroke.  With the DASH eating plan, you should limit salt (sodium) intake to 2,300 mg a day. If you have hypertension, you may need to reduce your sodium intake to 1,500 mg a day.  When on the DASH eating plan, aim to eat more fresh fruits and vegetables, whole grains, lean proteins, low-fat dairy, and heart-healthy fats.  Work with your health care provider or diet and nutrition specialist (dietitian) to adjust your eating plan to your individual calorie needs. This information is not intended  to replace advice given to you by your health care provider. Make sure you discuss any questions you have with your health care provider. Document Released: 11/17/2011 Document Revised: 11/10/2017 Document Reviewed: 11/21/2016 Elsevier Patient Education  2020 [employer]

## 2019-09-09 NOTE — Progress Notes (Signed)
Patient here for BP check. States that he stopped the HCTZ about 1 month ago. States home readings have been normal. Did not bring machine with him.  Denies chest pain, SHOB, headaches, dizziness, palpitations.   Declines flu shot.

## 2019-09-12 ENCOUNTER — Telehealth: Payer: Self-pay | Admitting: Cardiovascular Disease

## 2019-09-12 NOTE — Telephone Encounter (Signed)
LVM for patient to call and schedule a new patient appt.

## 2019-09-17 ENCOUNTER — Telehealth: Payer: Self-pay | Admitting: *Deleted

## 2019-09-17 NOTE — Telephone Encounter (Signed)
A message was left, re: his new patient appointment. 

## 2019-09-25 ENCOUNTER — Ambulatory Visit: Payer: Medicaid Other | Admitting: Cardiovascular Disease

## 2019-10-02 ENCOUNTER — Telehealth: Payer: Self-pay | Admitting: *Deleted

## 2019-10-02 NOTE — Telephone Encounter (Signed)
A message was left, re: his new patient from 09/25/19 with Dr.Berry.

## 2021-07-19 ENCOUNTER — Ambulatory Visit
Admission: EM | Admit: 2021-07-19 | Discharge: 2021-07-19 | Disposition: A | Discharge status: Home / Self Care | Payer: Medicaid Other | Attending: Internal Medicine | Admitting: Internal Medicine

## 2021-07-19 ENCOUNTER — Encounter: Payer: Self-pay | Admitting: Emergency Medicine

## 2021-07-19 DIAGNOSIS — Z20822 Contact with and (suspected) exposure to covid-19: Secondary | ICD-10-CM

## 2021-07-19 NOTE — ED Triage Notes (Signed)
Pt here for covid testing after exposure at work; denies sx

## 2021-07-20 LAB — SARS-COV-2, NAA 2 DAY TAT

## 2021-07-20 LAB — NOVEL CORONAVIRUS, NAA: SARS-CoV-2, NAA: NOT DETECTED

## 2024-01-03 ENCOUNTER — Ambulatory Visit
Admission: EM | Admit: 2024-01-03 | Discharge: 2024-01-03 | Disposition: A | Discharge status: Home / Self Care | Payer: Worker's Compensation | Attending: Family Medicine | Admitting: Family Medicine

## 2024-01-03 DIAGNOSIS — Z026 Encounter for examination for insurance purposes: Secondary | ICD-10-CM

## 2024-01-03 DIAGNOSIS — T148XXA Other injury of unspecified body region, initial encounter: Secondary | ICD-10-CM

## 2024-01-03 DIAGNOSIS — W540XXA Bitten by dog, initial encounter: Secondary | ICD-10-CM

## 2024-01-03 MED ORDER — AMOXICILLIN-POT CLAVULANATE 875-125 MG PO TABS: 1.0000 | ORAL_TABLET | Freq: Two times a day (BID) | ORAL | 0 refills | Status: AC

## 2024-01-03 MED ORDER — TETANUS-DIPHTH-ACELL PERTUSSIS 5-2.5-18.5 LF-MCG/0.5 IM SUSY
0.5000 mL | PREFILLED_SYRINGE | Freq: Once | INTRAMUSCULAR | Status: AC
  Administered 2024-01-03: 0.5 mL via INTRAMUSCULAR

## 2024-01-03 NOTE — ED Provider Notes (Signed)
[facility]    CSN: [id number] Arrival date & time: 01/03/24  1700      History   Chief Complaint Chief Complaint  Patient presents with   Animal Bite    HPI Marc Duran is a 23 y.o. male.    Animal Bite Patient presents today with a Worker's Comp. related complaint.  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  Patient reports that his tetanus is not up-to-date.  He reports that he did speak with the dog's owner and declines rabies vaccine today as the dog that bit him is up-to-date on all of his shots.  Past Medical History:  Diagnosis Date   Childhood asthma     Patient Active Problem List   Diagnosis Date Noted   History of hypertension 09/09/2019    Past Surgical History:  Procedure Laterality Date   NO PAST SURGERIES         Home Medications    Prior to Admission medications   Medication Sig Start Date End Date Taking? Authorizing Provider  amoxicillin-clavulanate (AUGMENTIN) 875-125 MG tablet Take 1 tablet by mouth every 12 (twelve) hours. 01/03/24  Yes Bing Neighbors, NP    Family History Family History  Problem Relation Age of Onset   Healthy Mother    Hypertension Father    Hypertension Maternal Grandmother    Diabetes Maternal Grandmother    Hypertension Maternal Grandfather    Hypertension Paternal Grandmother    Hypertension Paternal Grandfather     Social History Social History   Tobacco Use   Smoking status: Never   Smokeless tobacco: Never  Vaping Use   Vaping status: Never Used  Substance Use Topics   Alcohol use: Not Currently    Comment: Occassionally.   Drug use: Yes    Frequency: 2.0 times per week    Types: Marijuana     Allergies   Patient has no known allergies.   Review of Systems Review of Systems Pertinent negatives listed in  HPI   Physical Exam Triage Vital Signs ED Triage Vitals  Encounter Vitals Group     BP 01/03/24 1811 (!) 144/1     Systolic BP Percentile --      Diastolic BP Percentile --      Pulse Rate 01/03/24 1811 (!) 51     Resp 01/03/24 1811 16     Temp 01/03/24 1811 (!) 97.3 F (36.3 C)     Temp Source 01/03/24 1811 Oral     SpO2 01/03/24 1811 97 %     Weight 01/03/24 1808 150 lb 12.8 oz (68.4 kg)     Height 01/03/24 1808 5' 7.6" (1.717 m)     Head Circumference --      Peak Flow --      Pain Score 01/03/24 1803 0     Pain Loc --      Pain Education --      Exclude from Growth Chart --    No data found.  Updated Vital Signs BP (!) 142/86 (BP Location: Right Arm)   Pulse (!) 54   Temp (!) 97.3 F (36.3 C) (Oral)   Resp 16   Ht 5' 7.6" (1.717 m)   Wt 150 lb 12.8 oz (68.4 kg)   SpO2 97%   BMI 23.20 kg/m   Visual Acuity  Right Eye Distance:   Left Eye Distance:   Bilateral Distance:    Right Eye Near:   Left Eye Near:    Bilateral Near:     Physical Exam Vitals and nursing note reviewed.  Constitutional:      Appearance: Normal appearance.  HENT:     Head: Normocephalic and atraumatic.     Right Ear: External ear normal.     Left Ear: External ear normal.     Nose: Nose normal.  Cardiovascular:     Rate and Rhythm: Normal rate and regular rhythm.  Pulmonary:     Effort: Pulmonary effort is normal.     Breath sounds: Normal breath sounds.  Musculoskeletal:     Cervical back: Normal range of motion.  Skin:    General: Skin is warm.     Capillary Refill: Capillary refill takes less than 2 seconds.     Findings: Abrasion and wound (dog abrasions buttocks and left hand) present.  Neurological:     Mental Status: He is alert.      UC Treatments / Results  Labs (all labs ordered are listed, but only abnormal results are displayed) Labs Reviewed - No data to display  EKG   Radiology No results found.  Procedures Procedures (including critical care  time)  Medications Ordered in UC Medications  Tdap (BOOSTRIX) injection 0.5 mL (0.5 mLs Intramuscular Given 01/03/24 2021)    Initial Impression / Assessment and Plan / UC Course  I have reviewed the triage vital signs and the nursing notes.  Pertinent labs & imaging results that were available during my care of the patient were reviewed by me and considered in my medical decision making (see chart for details).    1. Encounter related to worker's compensation claim (Primary) - Tdap (BOOSTRIX) injection 0.5 mL - amoxicillin-clavulanate (AUGMENTIN) 875-125 MG tablet; Take 1 tablet by mouth every 12 (twelve) hours.  Dispense: 14 tablet; Refill: 0  2. Superficial wound due to dog bite - Tdap (BOOSTRIX) injection 0.5 mL - amoxicillin-clavulanate (AUGMENTIN) 875-125 MG tablet; Take 1 tablet by mouth every 12 (twelve) hours.  Dispense: 14 tablet; Refill: 0   Patient refused Rabies series as he reports talking with dog owner who advised him dog was up to date on vaccines and per patient owner advised him that he would forward updated vaccine records of dog to the patient. Patient would like to await information from dog owner.  RN collected county animal report from patient. Final Clinical Impressions(s) / UC Diagnoses   Final diagnoses:  Encounter related to worker's compensation claim  Superficial wound due to dog bite     Discharge Instructions      Start Augmentin twice daily for 7 days for prophylaxis against dog wound infection.  Complete entire course of medication.  You also received a tetanus today.     ED Prescriptions     Medication Sig Dispense Auth. Provider   amoxicillin-clavulanate (AUGMENTIN) 875-125 MG tablet Take 1 tablet by mouth every 12 (twelve) hours. 14 tablet Bing Neighbors, NP      PDMP not reviewed this encounter.   Bing Neighbors, NP 01/06/24 1137

## 2024-01-03 NOTE — ED Triage Notes (Addendum)
"  Yesterday I got attacked by a dog at work". "Bitten on right hand and right buttocks". "He was a customer's pet, utd on vaccines per customer (waiting on clarification)". No fever. Employer: [employer] (Contract for [employer]), Reported to them. Tdap not UTD.

## 2024-01-03 NOTE — Discharge Instructions (Addendum)
Start Augmentin twice daily for 7 days for prophylaxis against dog wound infection.  Complete entire course of medication.  You also received a tetanus today.
